# Patient Record
Sex: Male | Born: 1940 | Race: White | Hispanic: Yes | Marital: Married | Smoking: Never smoker
Health system: Southern US, Community
[De-identification: ages and names within clinical notes are randomized; demographics above are authoritative.]

## PROBLEM LIST (undated history)

## (undated) DIAGNOSIS — I2510 Atherosclerotic heart disease of native coronary artery without angina pectoris: Secondary | ICD-10-CM

## (undated) DIAGNOSIS — E119 Type 2 diabetes mellitus without complications: Secondary | ICD-10-CM

## (undated) DIAGNOSIS — I10 Essential (primary) hypertension: Secondary | ICD-10-CM

## (undated) HISTORY — PX: EYE SURGERY: SHX253

## (undated) HISTORY — PX: CORONARY STENT PLACEMENT: SHX1402

---

## 2015-02-15 ENCOUNTER — Emergency Department (HOSPITAL_COMMUNITY)
Admission: EM | Admit: 2015-02-15 | Discharge: 2015-02-16 | Disposition: A | Discharge status: Home / Self Care | Payer: Medicare HMO | Attending: Emergency Medicine | Admitting: Emergency Medicine

## 2015-02-15 ENCOUNTER — Encounter (HOSPITAL_COMMUNITY): Payer: Self-pay | Admitting: Emergency Medicine

## 2015-02-15 ENCOUNTER — Emergency Department (HOSPITAL_COMMUNITY): Payer: Medicare HMO

## 2015-02-15 DIAGNOSIS — R5383 Other fatigue: Secondary | ICD-10-CM | POA: Insufficient documentation

## 2015-02-15 DIAGNOSIS — E119 Type 2 diabetes mellitus without complications: Secondary | ICD-10-CM | POA: Diagnosis not present

## 2015-02-15 DIAGNOSIS — Z9861 Coronary angioplasty status: Secondary | ICD-10-CM | POA: Insufficient documentation

## 2015-02-15 DIAGNOSIS — Z79899 Other long term (current) drug therapy: Secondary | ICD-10-CM | POA: Insufficient documentation

## 2015-02-15 DIAGNOSIS — I2510 Atherosclerotic heart disease of native coronary artery without angina pectoris: Secondary | ICD-10-CM | POA: Insufficient documentation

## 2015-02-15 DIAGNOSIS — R0981 Nasal congestion: Secondary | ICD-10-CM | POA: Diagnosis not present

## 2015-02-15 DIAGNOSIS — R05 Cough: Secondary | ICD-10-CM | POA: Insufficient documentation

## 2015-02-15 DIAGNOSIS — R112 Nausea with vomiting, unspecified: Secondary | ICD-10-CM | POA: Diagnosis present

## 2015-02-15 DIAGNOSIS — R1013 Epigastric pain: Secondary | ICD-10-CM | POA: Insufficient documentation

## 2015-02-15 DIAGNOSIS — I10 Essential (primary) hypertension: Secondary | ICD-10-CM | POA: Insufficient documentation

## 2015-02-15 HISTORY — DX: Type 2 diabetes mellitus without complications: E11.9

## 2015-02-15 HISTORY — DX: Essential (primary) hypertension: I10

## 2015-02-15 HISTORY — DX: Atherosclerotic heart disease of native coronary artery without angina pectoris: I25.10

## 2015-02-15 LAB — COMPREHENSIVE METABOLIC PANEL
ALT: 22 U/L (ref 17–63)
AST: 24 U/L (ref 15–41)
Albumin: 3.9 g/dL (ref 3.5–5.0)
Alkaline Phosphatase: 80 U/L (ref 38–126)
Anion gap: 8 (ref 5–15)
BILIRUBIN TOTAL: 1.4 mg/dL — AB (ref 0.3–1.2)
BUN: 20 mg/dL (ref 6–20)
CO2: 30 mmol/L (ref 22–32)
Calcium: 9.2 mg/dL (ref 8.9–10.3)
Chloride: 94 mmol/L — ABNORMAL LOW (ref 101–111)
Creatinine, Ser: 1.18 mg/dL (ref 0.61–1.24)
GFR calc Af Amer: >60 mL/min (ref >60)
GFR calc non Af Amer: 58 mL/min — ABNORMAL LOW (ref >60)
GLUCOSE: 127 mg/dL — AB (ref 65–99)
POTASSIUM: 3.8 mmol/L (ref 3.5–5.1)
Sodium: 132 mmol/L — ABNORMAL LOW (ref 135–145)
TOTAL PROTEIN: 6.6 g/dL (ref 6.5–8.1)

## 2015-02-15 LAB — CBC
HEMATOCRIT: 42.6 % (ref 39.0–52.0)
HEMOGLOBIN: 15 g/dL (ref 13.0–17.0)
MCH: 32 pg (ref 26.0–34.0)
MCHC: 35.2 g/dL (ref 30.0–36.0)
MCV: 90.8 fL (ref 78.0–100.0)
Platelets: 228 10*3/uL (ref 150–400)
RBC: 4.69 MIL/uL (ref 4.22–5.81)
RDW: 12.5 % (ref 11.5–15.5)
WBC: 8.1 10*3/uL (ref 4.0–10.5)

## 2015-02-15 LAB — URINALYSIS, ROUTINE W REFLEX MICROSCOPIC
BILIRUBIN URINE: NEGATIVE
GLUCOSE, UA: NEGATIVE mg/dL
Ketones, ur: NEGATIVE mg/dL
Leukocytes, UA: NEGATIVE
Nitrite: NEGATIVE
PH: 7.5 (ref 5.0–8.0)
Protein, ur: 100 mg/dL — AB
SPECIFIC GRAVITY, URINE: 1.012 (ref 1.005–1.030)
Urobilinogen, UA: 0.2 mg/dL (ref 0.0–1.0)

## 2015-02-15 LAB — URINE MICROSCOPIC-ADD ON

## 2015-02-15 MED ORDER — LIDOCAINE VISCOUS 2 % MT SOLN
15.0000 mL | Freq: Once | OROMUCOSAL | Status: AC
  Administered 2015-02-16: 15 mL via OROMUCOSAL
  Filled 2015-02-15: qty 15

## 2015-02-15 MED ORDER — ALUM & MAG HYDROXIDE-SIMETH 200-200-20 MG/5ML PO SUSP
15.0000 mL | Freq: Once | ORAL | Status: AC
  Administered 2015-02-16: 15 mL via ORAL
  Filled 2015-02-15: qty 30

## 2015-02-15 MED ORDER — ONDANSETRON HCL 4 MG/2ML IJ SOLN
4.0000 mg | Freq: Once | INTRAMUSCULAR | Status: AC
  Administered 2015-02-16: 4 mg via INTRAVENOUS
  Filled 2015-02-15: qty 2

## 2015-02-15 MED ORDER — IOHEXOL 300 MG/ML  SOLN
100.0000 mL | Freq: Once | INTRAMUSCULAR | Status: AC | PRN
Start: 2015-02-15 — End: 2015-02-15
  Administered 2015-02-15: 100 mL via INTRAVENOUS

## 2015-02-15 MED ORDER — SODIUM CHLORIDE 0.9 % IV BOLUS (SEPSIS)
1000.0000 mL | Freq: Once | INTRAVENOUS | Status: AC
  Administered 2015-02-16: 1000 mL via INTRAVENOUS

## 2015-02-15 NOTE — ED Provider Notes (Signed)
CSN: [id number]   Arrival date & time 02/15/15 1944  History  By signing my name below, I, Bethel Born, attest that this documentation has been prepared under the direction and in the presence of Melene Plan, DO. Electronically Signed: Bethel Born, ED Scribe. 02/15/2015. 12:19 AM.  Chief Complaint  Patient presents with  . Emesis    HPI The history is provided by the patient. No language interpreter was used.   Pedro Thomas is a 74 y.o. male with history of HTN, DM, and CAD who presents to the Emergency Department complaining of nausea and vomiting with onset today around 2 PM after lunch. Pt reports 2 episodes of non-bloody emesis. He has been unable to take his medication because of the nausea.  Associated symptoms include 3 days of nasal congestion, cough, and fatigue. Pt denies fever, chest pain, SOB, abdominal pain, and diarrhea. Denies suspicious food intake.  Pt is visiting the area from [place] and plans to return in the morning.   Past Medical History  Diagnosis Date  . Hypertension   . Diabetes mellitus without complication (HCC)   . Coronary artery disease     Past Surgical History  Procedure Laterality Date  . Coronary stent placement    . Eye surgery      No family history on file.  Social History  Substance Use Topics  . Smoking status: Never Smoker   . Smokeless tobacco: None  . Alcohol Use: Yes     Review of Systems  Constitutional: Positive for fatigue. Negative for fever and chills.  HENT: Positive for congestion. Negative for facial swelling.   Eyes: Negative for discharge and visual disturbance.  Respiratory: Positive for cough. Negative for shortness of breath.   Cardiovascular: Negative for chest pain and palpitations.  Gastrointestinal: Positive for nausea and vomiting. Negative for abdominal pain and diarrhea.  Musculoskeletal: Negative for myalgias and arthralgias.  Skin: Negative for color change and rash.  Neurological: Negative for  tremors, syncope and headaches.  Psychiatric/Behavioral: Negative for confusion and dysphoric mood.   Home Medications   Prior to Admission medications   Medication Sig Start Date End Date Taking? Authorizing Provider  candesartan (ATACAND) 16 MG tablet Take 16 mg by mouth 2 (two) times daily.   Yes Historical Provider, MD  cetirizine (ZYRTEC) 10 MG tablet Take 10 mg by mouth at bedtime.   Yes Historical Provider, MD  clopidogrel (PLAVIX) 75 MG tablet Take 75 mg by mouth daily after lunch.   Yes Historical Provider, MD  doxazosin (CARDURA) 2 MG tablet Take 2 mg by mouth at bedtime.   Yes Historical Provider, MD  furosemide (LASIX) 20 MG tablet Take 20 mg by mouth every morning.   Yes Historical Provider, MD  insulin NPH-regular Human (NOVOLIN 70/30) (70-30) 100 UNIT/ML injection Inject 14-24 Units into the skin 2 (two) times daily with a meal. Take 24 units in the morning at 8am and 14 units in the evening at 7pm   Yes Historical Provider, MD  levothyroxine (SYNTHROID, LEVOTHROID) 88 MCG tablet Take 88 mcg by mouth daily before breakfast.   Yes Historical Provider, MD  nebivolol (BYSTOLIC) 2.5 MG tablet Take 2.5 mg by mouth every evening.   Yes Historical Provider, MD  nebivolol (BYSTOLIC) 5 MG tablet Take 5 mg by mouth every morning.   Yes Historical Provider, MD  NIFEdipine (PROCARDIA XL/ADALAT-CC) 60 MG 24 hr tablet Take 60 mg by mouth every morning.   Yes Historical Provider, MD  NIFEdipine (PROCARDIA-XL/ADALAT-CC/NIFEDICAL-XL) 30 MG  24 hr tablet Take 30 mg by mouth every evening.   Yes Historical Provider, MD  ondansetron (ZOFRAN) 4 MG tablet Take 4 mg by mouth every 8 (eight) hours as needed for nausea or vomiting.   Yes Historical Provider, MD  pravastatin (PRAVACHOL) 20 MG tablet Take 20 mg by mouth at bedtime.   Yes Historical Provider, MD  ranitidine (ZANTAC) 150 MG tablet Take 150 mg by mouth every morning.   Yes Historical Provider, MD  senna (SENOKOT) 8.6 MG TABS tablet Take 1  tablet by mouth daily.   Yes Historical Provider, MD  ondansetron (ZOFRAN ODT) 4 MG disintegrating tablet Take 1 tablet (4 mg total) by mouth every 8 (eight) hours as needed for nausea or vomiting. 02/16/15   Melene Planan Deasiah Hagberg, DO    Allergies  Review of patient's allergies indicates no known allergies.  Triage Vitals: BP 194/62 mmHg  Pulse 69  Temp(Src) 99.1 F (37.3 C) (Oral)  Resp 20  Ht 5\' 4"  (1.626 m)  Wt 155 lb (70.308 kg)  BMI 26.59 kg/m2  SpO2 99%  Physical Exam  Constitutional: He is oriented to person, place, and time. He appears well-developed and well-nourished.  HENT:  Head: Normocephalic and atraumatic.  Eyes: EOM are normal. Pupils are equal, round, and reactive to light.  Neck: Normal range of motion. Neck supple. No JVD present.  Cardiovascular: Normal rate and regular rhythm.  Exam reveals no gallop and no friction rub.   No murmur heard. Pulmonary/Chest: No respiratory distress. He has no wheezes.  CTAB  Abdominal: He exhibits no distension. There is tenderness. There is no rebound, no guarding and negative Murphy's sign.  Mild abdominal pain that is worse in the epigastrium  Musculoskeletal: Normal range of motion. He exhibits no edema.  No noted edema  Neurological: He is alert and oriented to person, place, and time.  Skin: No rash noted. No pallor.  Psychiatric: He has a normal mood and affect. His behavior is normal.  Nursing note and vitals reviewed.   ED Course  Procedures   DIAGNOSTIC STUDIES: Oxygen Saturation is 99% on RA, normal by my interpretation.    COORDINATION OF CARE: 11:16 PM Discussed treatment plan which includes lab work, CT A/P with contrast, EKG, an antiemetic, and IVF with pt at bedside and pt agreed to plan.  12:37 AM I re-evaluated the patient and provided an update on the results of his CT scan.     Labs Reviewed  COMPREHENSIVE METABOLIC PANEL - Abnormal; Notable for the following:    Sodium 132 (*)    Chloride 94 (*)     Glucose, Bld 127 (*)    Total Bilirubin 1.4 (*)    GFR calc non Af Amer 58 (*)    All other components within normal limits  URINALYSIS, ROUTINE W REFLEX MICROSCOPIC (NOT AT [facility]) - Abnormal; Notable for the following:    Hgb urine dipstick SMALL (*)    Protein, ur 100 (*)    All other components within normal limits  CBC  URINE MICROSCOPIC-ADD ON  LIPASE, BLOOD    Imaging Review Ct Abdomen Pelvis W Contrast  02/16/2015  CLINICAL DATA:  Nausea and emesis today.  Generalized weakness. EXAM: CT ABDOMEN AND PELVIS WITH CONTRAST TECHNIQUE: Multidetector CT imaging of the abdomen and pelvis was performed using the standard protocol following bolus administration of intravenous contrast. CONTRAST:  100mL OMNIPAQUE IOHEXOL 300 MG/ML  SOLN COMPARISON:  None. FINDINGS: Lower chest:  No significant abnormality Hepatobiliary: There is an  11 mm hypodense focus in the posterior right hepatic lobe which is probably benign cysts although it cannot be conclusively characterized. There are otherwise normal appearances of the liver, gallbladder and bile ducts. Pancreas: Normal Spleen: Normal Adrenals/Urinary Tract: The adrenals and kidneys are normal in appearance. There is no urinary calculus evident. There is no hydronephrosis or ureteral dilatation. Collecting systems and ureters appear unremarkable. Stomach/Bowel: There is a small hiatal hernia. The stomach, small bowel, appendix and colon are otherwise unremarkable. Vascular/Lymphatic: The abdominal aorta is normal in caliber. There is mild atherosclerotic calcification. There is no adenopathy in the abdomen or pelvis. Reproductive: Prominent enlargement of the prostate and seminal vesicles. Other: No focal inflammatory changes are evident. There is no ascites. There is no extraluminal air. There is a small fat containing umbilical hernia. Musculoskeletal: No significant musculoskeletal lesion. IMPRESSION: 1. No acute findings are evident in the abdomen or pelvis.  2. Small hiatal hernia. 3. Tiny fat containing umbilical hernia. 4. 11 mm right hepatic lobe hypodensity, probably a benign cyst. Electronically Signed   By: Ellery Plunk M.D.   On: 02/16/2015 00:28    I personally reviewed and evaluated these images and lab results as a part of my medical decision-making.   EKG Interpretation  Date/Time:  Tuesday February 16 2015 00:58:04 EDT Ventricular Rate:  73 PR Interval:  187 QRS Duration: 95 QT Interval:  414 QTC Calculation: 456 R Axis:   -62 Text Interpretation:  Sinus rhythm LAD, consider left anterior fascicular block Borderline low voltage, extremity leads RSR' in V1 or V2, right VCD or RVH Baseline wander in lead(s) V4 No old tracing to compare Confirmed by Iyanla Eilers MD, DANIEL ([id number]) on 02/16/2015 1:14:41 AM    MDM   Final diagnoses:  Non-intractable vomiting with nausea, vomiting of unspecified type    Patient is a 74 y.o. male who presents with nausea and vomiting.  This started a couple days ago. Patient has had difficulty keeping anything down. Patient states this started with upper respiratory like symptoms cough congestion. Patient denies fevers or chills. Denies diarrhea. Denies abdominal pain. Patient with mild epigastric abdominal pain on exam. Lipase CMP unremarkable. Negative Murphy sign. We'll obtain a CT scan of the abdomen and pelvis with contrast.   CT scan negative. Patient feeling much better after Zofran. Repeat abdominal exam benign. Love the patient follow with his PCP.  I personally performed the services described in this documentation, which was scribed in my presence. The recorded information has been reviewed and is accurate.    I have discussed the diagnosis/risks/treatment options with the patient and believe the pt to be eligible for discharge home to follow-up with PCP. We also discussed returning to the ED immediately if new or worsening sx occur. We discussed the sx which are most concerning (e.g., sudden  worsening pain, fever, inability to tolerate by mouth) that necessitate immediate return. Medications administered to the patient during their visit and any new prescriptions provided to the patient are listed below.  Medications given during this visit Medications  sodium chloride 0.9 % bolus 1,000 mL (0 mLs Intravenous Stopped 02/16/15 0125)  ondansetron (ZOFRAN) injection 4 mg (4 mg Intravenous Given 02/16/15 0027)  alum & mag hydroxide-simeth (MAALOX/MYLANTA) 200-200-20 MG/5ML suspension 15 mL (15 mLs Oral Given 02/16/15 0027)  lidocaine (XYLOCAINE) 2 % viscous mouth solution 15 mL (15 mLs Mouth/Throat Given 02/16/15 0027)  iohexol (OMNIPAQUE) 300 MG/ML solution 100 mL (100 mLs Intravenous Contrast Given 02/15/15 2355)    Discharge Medication  List as of 02/16/2015 12:42 AM    START taking these medications   Details  ondansetron (ZOFRAN ODT) 4 MG disintegrating tablet Take 1 tablet (4 mg total) by mouth every 8 (eight) hours as needed for nausea or vomiting., Starting 02/16/2015, Until Discontinued, Print        The patient appears reasonably screen and/or stabilized for discharge and I doubt any other medical condition or other [facility] requiring further screening, evaluation, or treatment in the ED at this time prior to discharge.     Melene Plan, DO 02/16/15 [phone]

## 2015-02-15 NOTE — ED Notes (Signed)
Pt states he started having congestion in his nose and chest approx 3 days ago, and then 2 days ago began to have nausea and vomiting.  Pt denies any pain, but states he has felt too tired to travel back to [place] due to his symptoms.

## 2015-02-15 NOTE — ED Notes (Signed)
Pt. reports nausea and emesis x3 today with generalized weakness and mild dizziness .

## 2015-02-16 LAB — LIPASE, BLOOD: LIPASE: 24 U/L (ref 11–51)

## 2015-02-16 MED ORDER — ONDANSETRON 4 MG PO TBDP: 4.0000 mg | ORAL_TABLET | Freq: Three times a day (TID) | ORAL | Status: AC | PRN

## 2015-02-16 NOTE — ED Notes (Signed)
Pt qable to dress and ambulate independently

## 2015-02-16 NOTE — ED Notes (Signed)
Pt up to use the restroom.  Gait is steady and even.

## 2015-02-16 NOTE — Discharge Instructions (Signed)
Nuseas y Vmitos (Nausea and Vomiting) La nusea es la sensacin de malestar en el estmago o de la necesidad de vomitar. El vmito es un reflejo por el que los contenidos del estmago salen por la boca. El vmito puede ocasionar prdida de lquidos del organismo (deshidratacin). Los nios y los adultos mayores pueden deshidratarse rpidamente (en especial si tambin tienen diarrea). Las nuseas y los vmitos son sntoma de un trastorno o enfermedad. Es importante averiguar la causa de los sntomas. CAUSAS  Irritacin directa de la membrana que cubre el estmago. Esta irritacin puede ser resultado del aumento de la produccin de cido, (reflujo gastroesofgico), infecciones, intoxicacin alimentaria, ciertos medicamentos (como antinflamatorios no esteroideos), consumo de alcohol o de tabaco.  Seales del cerebro.Estas seales pueden ser un dolor de cabeza, exposicin al calor, trastornos del odo interno, aumento de la presin en el cerebro por lesiones, infeccin, un tumor o conmocin cerebral, estmulos emocionales o problemas metablicos.  Una obstruccin en el tracto gastrointestinal (obstruccin intestinal).  Ciertas enfermedades como la diabetes, problemas en la vescula biliar, apendicitis, problemas renales, cncer, sepsis, sntomas atpicos de infarto o trastornos alimentarios.  Tratamientos mdicos como la quimioterapia y la radiacin.  Medicamentos que inducen al sueo (anestesia general) durante una ciruga. DIAGNSTICO  El mdico podr solicitarle algunos anlisis si los problemas no mejoran luego de algunos das. Tambin podrn pedirle anlisis si los sntomas son graves o si el motivo de los vmitos o las nuseas no est claro. Los anlisis pueden ser:   Anlisis de orina.  Anlisis de sangre.  Pruebas de materia fecal.  Cultivos (para buscar evidencias de infeccin).  Radiografas u otros estudios por imgenes. Los resultados de las pruebas lo ayudarn al mdico a  tomar decisiones acerca del mejor curso de tratamiento o la necesidad de anlisis adicionales.  TRATAMIENTO  Debe estar bien hidratado. Beba con frecuencia pequeas cantidades de lquido.Puede beber agua, bebidas deportivas, caldos claros o comer pequeos trocitos de hielo o gelatina para mantenerse hidratado.Cuando coma, hgalo lentamente para evitar las nuseas.Hay medicamentos para evitar las nuseas que pueden aliviarlo.  INSTRUCCIONES PARA EL CUIDADO DOMICILIARIO  Si su mdico le prescribe medicamentos tmelos como se le haya indicado.  Si no tiene hambre, no se fuerce a comer. Sin embargo, es necesario que tome lquidos.  Si tiene hambre alimntese con una dieta normal, a menos que el mdico le indique otra cosa.  Los mejores alimentos son una combinacin de carbohidratos complejos (arroz, trigo, papas, pan), carnes magras, yogur, frutas y vegetales.  Evite los alimentos ricos en grasas porque dificultan la digestin.  Beba gran cantidad de lquido para mantener la orina de tono claro o color amarillo plido.  Si est deshidratado, consulte a su mdico para que le d instrucciones especficas para volver a hidratarlo. Los signos de deshidratacin son:  Mucha sed.  Labios y boca secos.  Mareos.  Orina oscura.  Disminucin de la frecuencia y cantidad de la orina.  Confusin.  Tiene el pulso o la respiracin acelerados. SOLICITE ATENCIN MDICA DE INMEDIATO SI:  Vomita sangre o algo similar a la borra del caf.  La materia fecal (heces) es negra o tiene sangre.  Sufre una cefalea grave o rigidez en el cuello.  Se siente confundido.  Siente dolor abdominal intenso.  Tiene dolor en el pecho o dificultad para respirar.  No orina por 8 horas.  Tiene la piel fra y pegajosa.  Sigue vomitando durante ms de 24 a 48 horas.  Tiene fiebre. ASEGRESE QUE:   Comprende   estas instrucciones.  Controlar su enfermedad.  Solicitar ayuda inmediatamente si no mejora o  si empeora.   Esta informacin no tiene como fin reemplazar el consejo del mdico. Asegrese de hacerle al mdico cualquier pregunta que tenga.   Document Released: 04/23/2007 Document Revised: 06/26/2011 Elsevier Interactive Patient Education 2016 [employer]  

## 2015-02-16 NOTE — ED Notes (Signed)
Pt back from restroom, denies any nauseas with movement.  States he is feeling better.  Will provide with Apple Juice and re-assess before discharge

## 2015-11-16 DEATH — deceased

## 2016-11-09 IMAGING — CT CT ABD-PELV W/ CM
2 of 5 series · 15 of 46 positions shown, 17 images · IV contrast (APPLIED)
Comparison: None.

CLINICAL DATA: Nausea and emesis today.  Generalized weakness.

EXAM:
CT ABDOMEN AND PELVIS WITH CONTRAST
TECHNIQUE: Multidetector CT imaging of the abdomen and pelvis was performed
using the standard protocol following bolus administration of
intravenous contrast.
CONTRAST:  100mL OMNIPAQUE IOHEXOL 300 MG/ML  SOLN

[Series 2: abd/ pelvis 5.0 i30f 1 · axial · 0.72mm/px · z∈[-456,-51]mm · 12 of 91 slices shown, 14 images]
[im 5/91  soft-tissue]
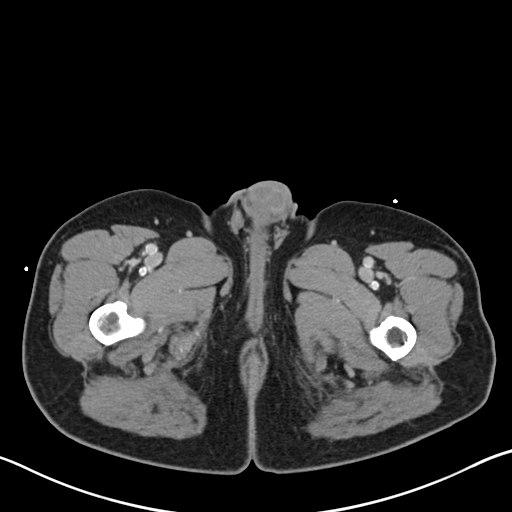
[im 5/91  bone]
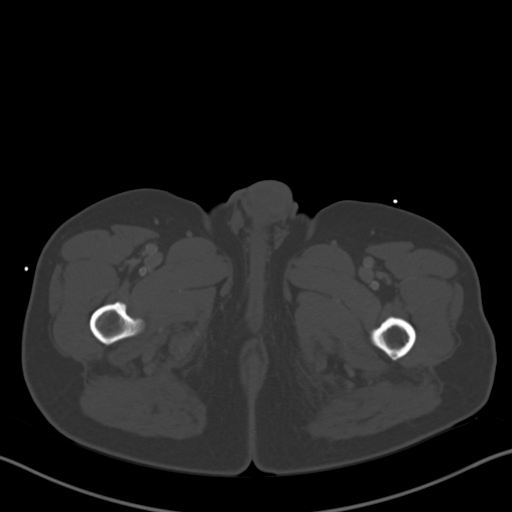
[im 15/91  soft-tissue]
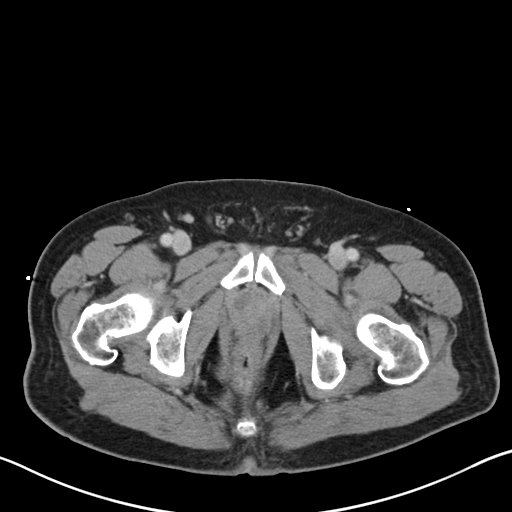
[im 19/91  soft-tissue]
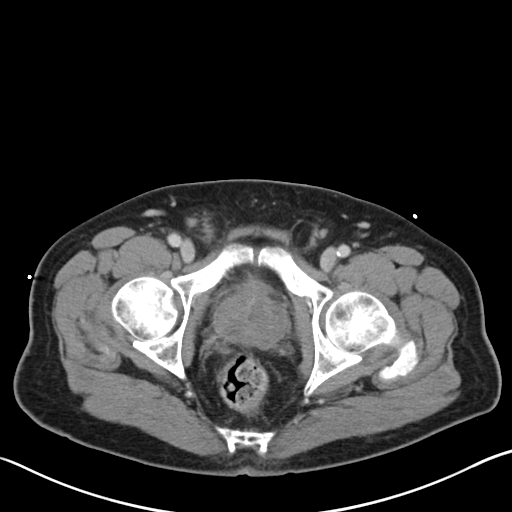
[im 29/91  soft-tissue]
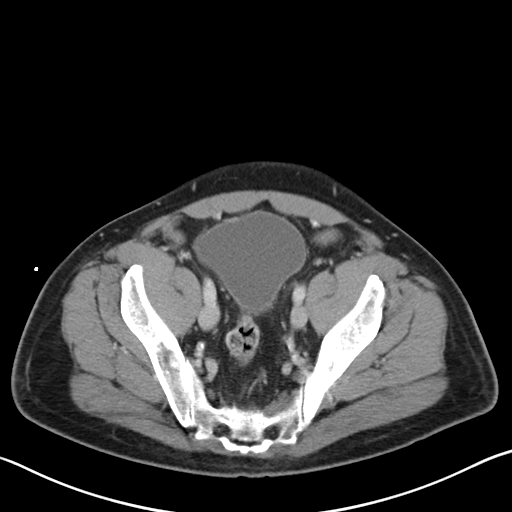
[im 34/91  soft-tissue]
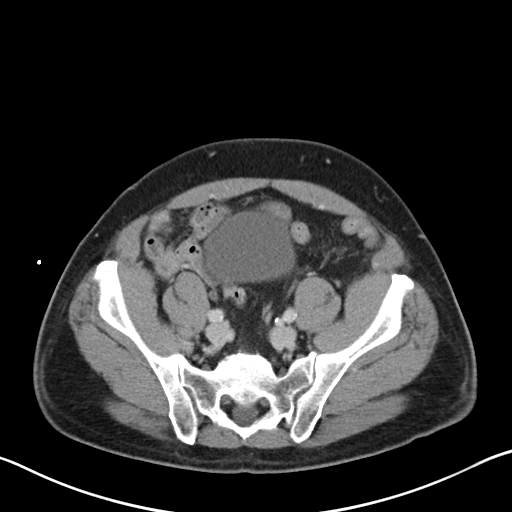
[im 43/91  soft-tissue]
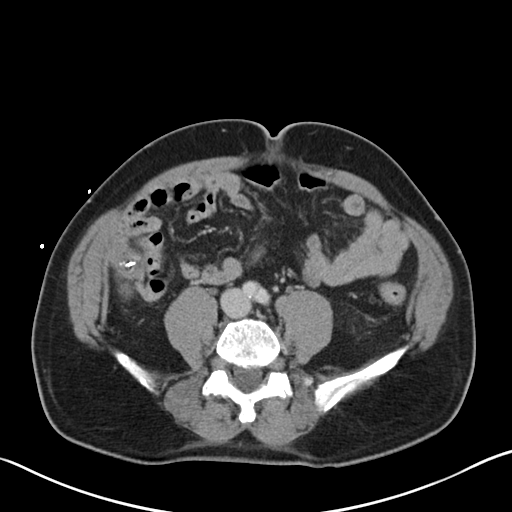
[im 48/91  soft-tissue]
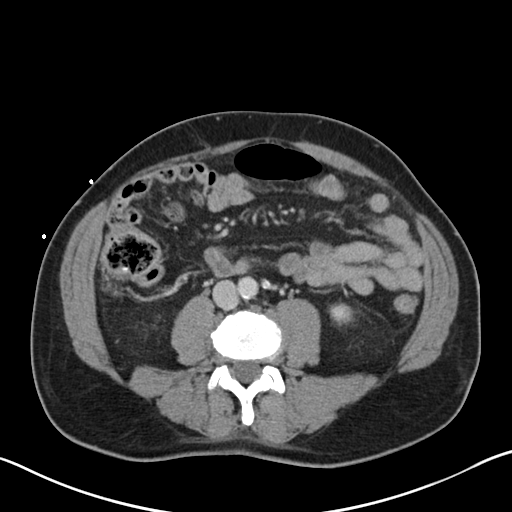
[im 57/91  soft-tissue]
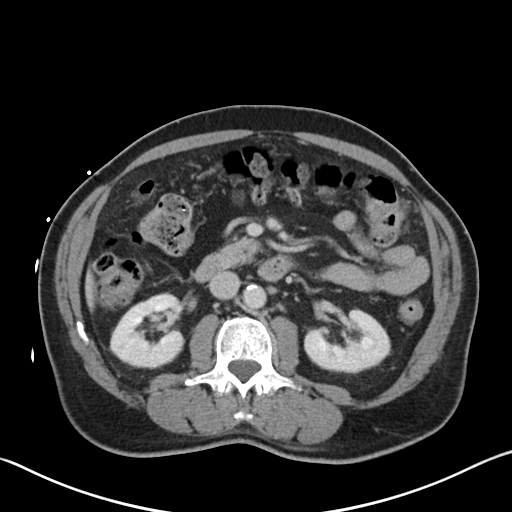
[im 62/91  soft-tissue]
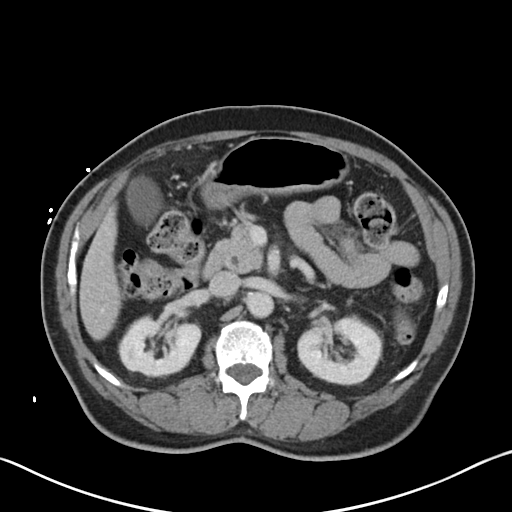
[im 62/91  bone]
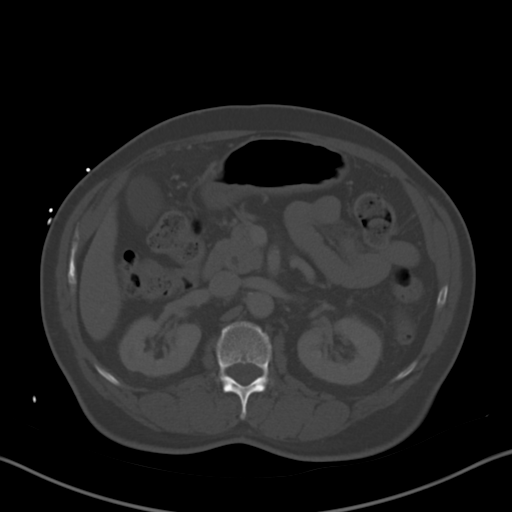
[im 72/91  soft-tissue]
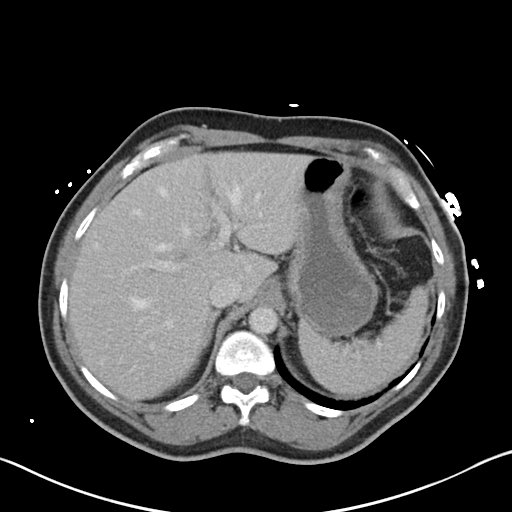
[im 76/91  soft-tissue]
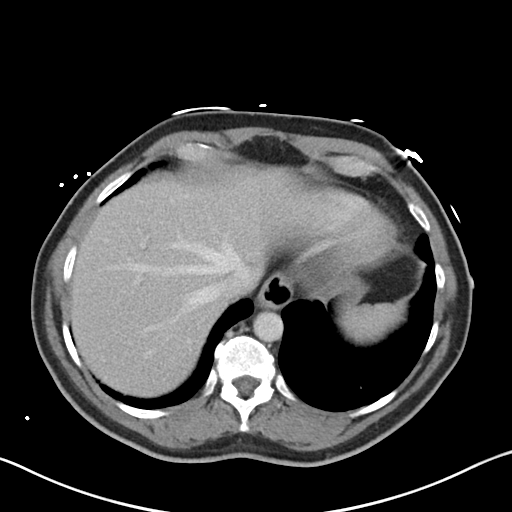
[im 86/91  soft-tissue]
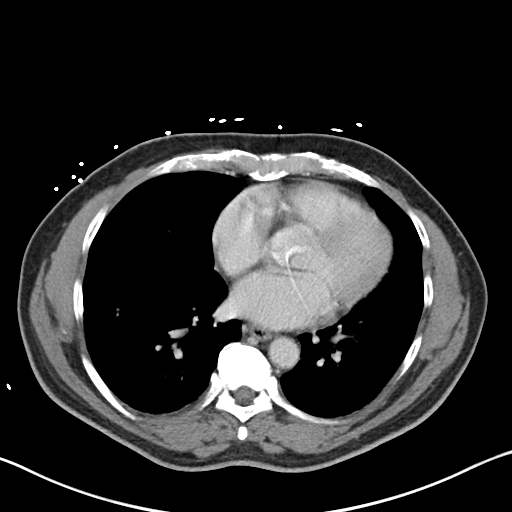

[Series 5: coronal soft tissue · coronal · 0.89mm/px · 3 of 77 slices shown]
[im 26/77  soft-tissue]
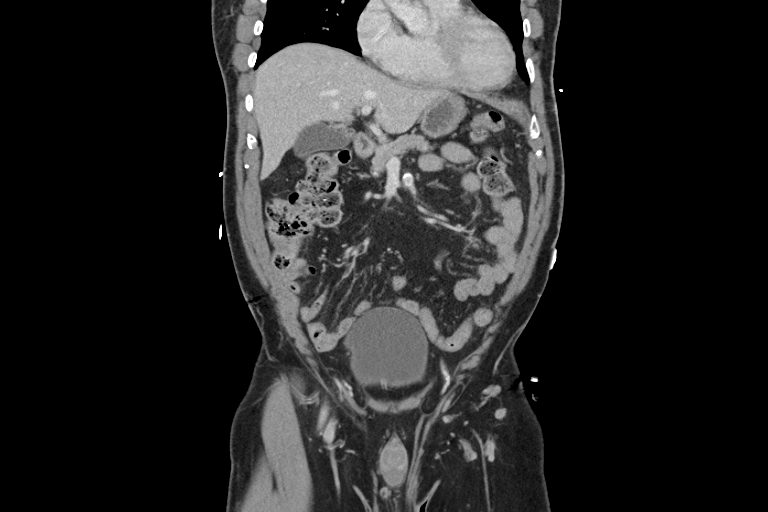
[im 34/77  soft-tissue]
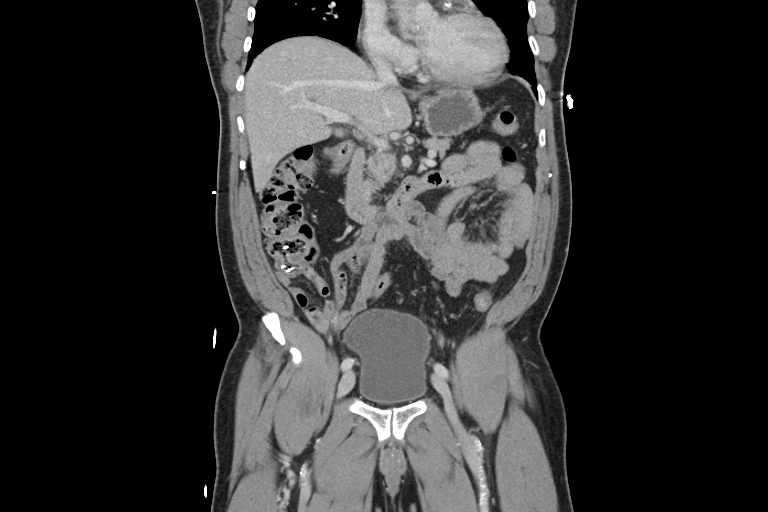
[im 43/77  soft-tissue]
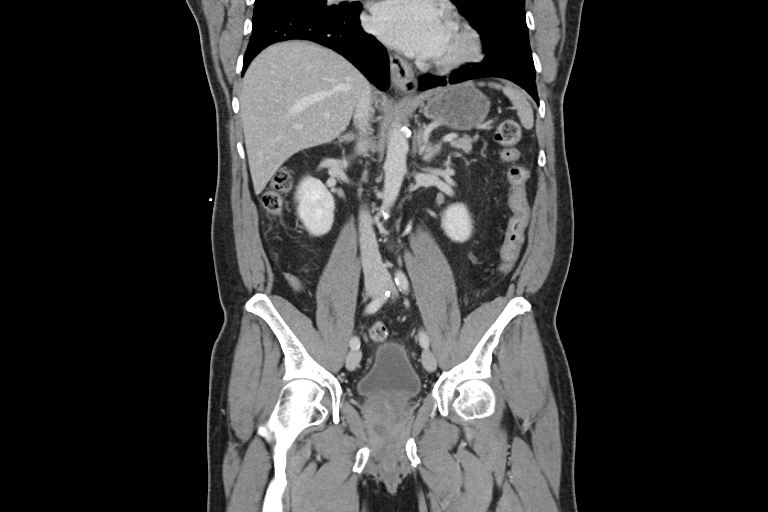

[15 of 46 positions shown; findings below may reference images not displayed]

FINDINGS: Lower chest:  No significant abnormality

Hepatobiliary: There is an 11 mm hypodense focus in the posterior
right hepatic lobe which is probably benign cysts although it cannot
be conclusively characterized. There are otherwise normal
appearances of the liver, gallbladder and bile ducts.

Pancreas: Normal

Spleen: Normal

Adrenals/Urinary Tract: The adrenals and kidneys are normal in
appearance. There is no urinary calculus evident. There is no
hydronephrosis or ureteral dilatation. Collecting systems and
ureters appear unremarkable.

Stomach/Bowel: There is a small hiatal hernia. The stomach, small
bowel, appendix and colon are otherwise unremarkable.

Vascular/Lymphatic: The abdominal aorta is normal in caliber. There
is mild atherosclerotic calcification. There is no adenopathy in the
abdomen or pelvis.

Reproductive: Prominent enlargement of the prostate and seminal
vesicles.

Other: No focal inflammatory changes are evident. There is no
ascites. There is no extraluminal air. There is a small fat
containing umbilical hernia.

Musculoskeletal: No significant musculoskeletal lesion.
IMPRESSION: 1. No acute findings are evident in the abdomen or pelvis.
2. Small hiatal hernia.
3. Tiny fat containing umbilical hernia.
4. 11 mm right hepatic lobe hypodensity, probably a benign cyst.
